# Patient Record
Sex: Male | Born: 1985 | Race: White | Hispanic: No | Marital: Single | Smoking: Never smoker
Health system: Southern US, Community
[De-identification: ages and names within clinical notes are randomized; demographics above are authoritative.]

## PROBLEM LIST (undated history)

## (undated) DIAGNOSIS — T7840XA Allergy, unspecified, initial encounter: Secondary | ICD-10-CM

## (undated) HISTORY — PX: HERNIA REPAIR: SHX51

## (undated) HISTORY — DX: Allergy, unspecified, initial encounter: T78.40XA

---

## 2017-03-31 ENCOUNTER — Encounter: Payer: Self-pay | Admitting: Family Medicine

## 2017-03-31 ENCOUNTER — Ambulatory Visit (INDEPENDENT_AMBULATORY_CARE_PROVIDER_SITE_OTHER): Payer: BLUE CROSS/BLUE SHIELD | Admitting: Family Medicine

## 2017-03-31 VITALS — BP 138/88 | HR 106 | Temp 98.4°F | Ht 76.0 in | Wt 223.2 lb

## 2017-03-31 DIAGNOSIS — N5089 Other specified disorders of the male genital organs: Secondary | ICD-10-CM | POA: Insufficient documentation

## 2017-03-31 DIAGNOSIS — R030 Elevated blood-pressure reading, without diagnosis of hypertension: Secondary | ICD-10-CM | POA: Diagnosis not present

## 2017-03-31 DIAGNOSIS — N509 Disorder of male genital organs, unspecified: Secondary | ICD-10-CM | POA: Diagnosis not present

## 2017-03-31 NOTE — Patient Instructions (Addendum)
I think you have a hydrocele or a varicocele. Both of these are benign. With your family history, I would like to get an ultrasound to make sure there is nothing else going on.  Please keep an eye on your blood pressure. It should be 140/90 or lower.  Please drop off your recent blood work when you are able to.   I would like to see you once yearly, or as needed,  Take care, Dr Jimmey RalphParker   [facility] Eating [facility] DASH stands for "Dietary Approaches to Stop Hypertension." The DASH eating plan is a healthy eating plan that has been shown to reduce high blood pressure (hypertension). It may also reduce your risk for type 2 diabetes, heart disease, and stroke. The DASH eating plan may also help with weight loss. What are tips for following this plan? General guidelines  Avoid eating more than 2,300 mg (milligrams) of salt (sodium) a day. If you have hypertension, you may need to reduce your sodium intake to 1,500 mg a day.  Limit alcohol intake to no more than 1 drink a day for nonpregnant women and 2 drinks a day for men. One drink equals 12 oz of beer, 5 oz of wine, or 1 oz of hard liquor.  Work with your health care provider to maintain a healthy body weight or to lose weight. Ask what an ideal weight is for you.  Get at least 30 minutes of exercise that causes your heart to beat faster (aerobic exercise) most days of the week. Activities may include walking, swimming, or biking.  Work with your health care provider or diet and nutrition specialist (dietitian) to adjust your eating plan to your individual calorie needs. Reading food labels  Check food labels for the amount of sodium per serving. Choose foods with less than 5 percent of the Daily Value of sodium. Generally, foods with less than 300 mg of sodium per serving fit into this eating plan.  To find whole grains, look for the word "whole" as the first word in the ingredient list. Shopping  Buy products labeled as "low-sodium" or "no  salt added."  Buy fresh foods. Avoid canned foods and premade or frozen meals. Cooking  Avoid adding salt when cooking. Use salt-free seasonings or herbs instead of table salt or sea salt. Check with your health care provider or pharmacist before using salt substitutes.  Do not fry foods. Cook foods using healthy methods such as baking, boiling, grilling, and broiling instead.  Cook with heart-healthy oils, such as olive, canola, soybean, or sunflower oil. Meal planning   Eat a balanced diet that includes: ? 5 or more servings of fruits and vegetables each day. At each meal, try to fill half of your plate with fruits and vegetables. ? Up to 6-8 servings of whole grains each day. ? Less than 6 oz of lean meat, poultry, or fish each day. A 3-oz serving of meat is about the same size as a deck of cards. One egg equals 1 oz. ? 2 servings of low-fat dairy each day. ? A serving of nuts, seeds, or beans 5 times each week. ? Heart-healthy fats. Healthy fats called Omega-3 fatty acids are found in foods such as flaxseeds and coldwater fish, like sardines, salmon, and mackerel.  Limit how much you eat of the following: ? Canned or prepackaged foods. ? Food that is high in trans fat, such as fried foods. ? Food that is high in saturated fat, such as fatty meat. ? Sweets,  desserts, sugary drinks, and other foods with added sugar. ? Full-fat dairy products.  Do not salt foods before eating.  Try to eat at least 2 vegetarian meals each week.  Eat more home-cooked food and less restaurant, buffet, and fast food.  When eating at a restaurant, ask that your food be prepared with less salt or no salt, if possible. What foods are recommended? The items listed may not be a complete list. Talk with your dietitian about what dietary choices are best for you. Grains Whole-grain or whole-wheat bread. Whole-grain or whole-wheat pasta. Brown rice. [place]. Bulgur. Whole-grain and low-sodium  cereals. Pita bread. Low-fat, low-sodium crackers. Whole-wheat flour tortillas. Vegetables Fresh or frozen vegetables (raw, steamed, roasted, or grilled). Low-sodium or reduced-sodium tomato and vegetable juice. Low-sodium or reduced-sodium tomato sauce and tomato paste. Low-sodium or reduced-sodium canned vegetables. Fruits All fresh, dried, or frozen fruit. Canned fruit in natural juice (without added sugar). Meat and other protein foods Skinless chicken or [place]. Ground chicken or [place]. Pork with fat trimmed off. Fish and seafood. Egg whites. Dried beans, peas, or lentils. Unsalted nuts, nut butters, and seeds. Unsalted canned beans. Lean cuts of beef with fat trimmed off. Low-sodium, lean deli meat. Dairy Low-fat (1%) or fat-free (skim) milk. Fat-free, low-fat, or reduced-fat cheeses. Nonfat, low-sodium ricotta or cottage cheese. Low-fat or nonfat yogurt. Low-fat, low-sodium cheese. Fats and oils Soft margarine without trans fats. Vegetable oil. Low-fat, reduced-fat, or light mayonnaise and salad dressings (reduced-sodium). Canola, safflower, olive, soybean, and sunflower oils. Avocado. Seasoning and other foods Herbs. Spices. Seasoning mixes without salt. Unsalted popcorn and pretzels. Fat-free sweets. What foods are not recommended? The items listed may not be a complete list. Talk with your dietitian about what dietary choices are best for you. Grains Baked goods made with fat, such as croissants, muffins, or some breads. Dry pasta or rice meal packs. Vegetables Creamed or fried vegetables. Vegetables in a cheese sauce. Regular canned vegetables (not low-sodium or reduced-sodium). Regular canned tomato sauce and paste (not low-sodium or reduced-sodium). Regular tomato and vegetable juice (not low-sodium or reduced-sodium). Angie Fava. Olives. Fruits Canned fruit in a light or heavy syrup. Fried fruit. Fruit in cream or butter sauce. Meat and other protein foods Fatty cuts of meat. Ribs.  Fried meat. Berniece Salines. Sausage. Bologna and other processed lunch meats. Salami. Fatback. Hotdogs. Bratwurst. Salted nuts and seeds. Canned beans with added salt. Canned or smoked fish. Whole eggs or egg yolks. Chicken or [place] with skin. Dairy Whole or 2% milk, cream, and half-and-half. Whole or full-fat cream cheese. Whole-fat or sweetened yogurt. Full-fat cheese. Nondairy creamers. Whipped toppings. Processed cheese and cheese spreads. Fats and oils Butter. Stick margarine. Lard. Shortening. Ghee. Bacon fat. Tropical oils, such as coconut, palm kernel, or palm oil. Seasoning and other foods Salted popcorn and pretzels. Onion salt, garlic salt, seasoned salt, table salt, and sea salt. Worcestershire sauce. Tartar sauce. Barbecue sauce. Teriyaki sauce. Soy sauce, including reduced-sodium. Steak sauce. Canned and packaged gravies. Fish sauce. Oyster sauce. Cocktail sauce. Horseradish that you find on the shelf. Ketchup. Mustard. Meat flavorings and tenderizers. Bouillon cubes. Hot sauce and Tabasco sauce. Premade or packaged marinades. Premade or packaged taco seasonings. Relishes. Regular salad dressings. Where to find more information:  National Heart, Lung, and [place]: https://wilson-eaton.com/  American Heart Association: www.heart.org Summary  The DASH eating plan is a healthy eating plan that has been shown to reduce high blood pressure (hypertension). It may also reduce your risk for type 2 diabetes, heart  disease, and stroke.  With the DASH eating plan, you should limit salt (sodium) intake to 2,300 mg a day. If you have hypertension, you may need to reduce your sodium intake to 1,500 mg a day.  When on the DASH eating plan, aim to eat more fresh fruits and vegetables, whole grains, lean proteins, low-fat dairy, and heart-healthy fats.  Work with your health care provider or diet and nutrition specialist (dietitian) to adjust your eating plan to your individual calorie needs. This  information is not intended to replace advice given to you by your health care provider. Make sure you discuss any questions you have with your health care provider. Document Released: 12/30/2010 Document Revised: 01/04/2016 Document Reviewed: 01/04/2016 Elsevier Interactive Patient Education  [employer].

## 2017-03-31 NOTE — Progress Notes (Signed)
Subjective:  Kevin Lutz is a 32 y.o. male who presents today with a chief complaint of testicular mass and to establish care.   HPI:  Testicular Mass, New Problem  Patient first noticed this several months ago.  He was evaluated by physician about 5 months ago who told him that was benign.  Mass is located to the posterior aspect of his left testicle.  No pain.  Mass has stayed the same for the past several months.  No hematuria.  No blood in semen.  No recent trauma.  No obvious precipitating events.  No other obvious alleviating or aggravating factors.  ROS: Per HPI, otherwise a complete review of systems was negative.   PMH:  The following were reviewed and entered/updated in epic: Past Medical History:  Diagnosis Date  . Allergy   . 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 Testicular mass 03/31/2017   Past Surgical History:  Procedure Laterality Date  . HERNIA REPAIR      Family History  Problem Relation Age of Onset  . Rheumatic fever Father   . Testicular cancer Father   . Arthritis Maternal Grandmother   . Hip fracture Paternal Grandmother   . Diabetes Paternal Grandfather    Medications- reviewed and updated No current outpatient medications on file.   No current facility-administered medications for this visit.    Allergies-reviewed and updated No Known Allergies  Social History   Socioeconomic History  . Marital status: Single    Spouse name: None  . Number of children: None  . Years of education: None  . Highest education level: None  Social Needs  . Financial resource strain: None  . Food insecurity - worry: None  . Food insecurity - inability: None  . Transportation needs - medical: None  . Transportation needs - non-medical: None  Occupational History  . None  Tobacco Use  . Smoking status: Never Smoker  . Smokeless tobacco: Never Used  Substance and Sexual Activity    . Alcohol use: Yes    Comment: occasional  . Drug use: No  . Sexual activity: Yes  Other Topics Concern  . None  Social History Narrative  . None   Objective:  Physical Exam: BP 138/88 (BP Location: Left Arm, Cuff Size: Large)   Pulse (!) 106   Temp 98.4 F (36.9 C) (Oral)   Ht  (1.93 m)   Wt 223 lb 3.2 oz (101.2 kg)   SpO2 99%   BMI 27.17 kg/m   Gen: NAD, resting comfortably CV: RRR with no murmurs appreciated Pulm: NWOB, CTAB with no crackles, wheezes, or rhonchi GI: Normal bowel sounds present. Soft, Nontender, Nondistended. GU: Normal male genitalia.  Left testicle with small, approximately 5 mm nodule at epididymis.  Nontender to palpation. MSK: No edema, cyanosis, or clubbing noted Skin: Warm, dry Neuro: Grossly normal, moves all extremities Psych: Normal affect and thought content  Assessment/Plan:  Testicular mass Most likely hydrocele or varicocele.  However he has a family history of testicular cancer in his father.  With this in mind, we will obtain an ultrasound for definitive diagnosis.  Elevated blood pressure Patient with blood pressure of 138/88 today.  Per patient he is usually in normal range at home.  We will not start medications today.  Advised close home blood pressure monitoring with goal 140/90 or lower.  Discussed lifestyle modifications including low-salt diet  and regular exercise.  Preventative healthcare Patient gets yearly cholesterol screening through his work.  He will be bringing those records to our office.  Katina Degree. Jimmey Ralph, MD 03/31/2017 9:21 AM

## 2017-03-31 NOTE — Assessment & Plan Note (Signed)
Most likely hydrocele or varicocele.  However he has a family history of testicular cancer in his father.  With this in mind, we will obtain an ultrasound for definitive diagnosis.

## 2017-04-11 ENCOUNTER — Ambulatory Visit
Admission: RE | Admit: 2017-04-11 | Discharge: 2017-04-11 | Disposition: A | Discharge status: Home / Self Care | Payer: BLUE CROSS/BLUE SHIELD | Source: Ambulatory Visit | Attending: Family Medicine | Admitting: Family Medicine

## 2017-04-11 DIAGNOSIS — N5089 Other specified disorders of the male genital organs: Secondary | ICD-10-CM

## 2017-04-12 NOTE — Progress Notes (Signed)
LVMOM with message from Dr. Jimmey RalphParker

## 2017-04-12 NOTE — Progress Notes (Signed)
LVMOM as to results

## 2018-05-09 ENCOUNTER — Telehealth: Payer: Self-pay | Admitting: Family Medicine

## 2018-05-09 NOTE — Telephone Encounter (Signed)
Pt has not been seen in over a year. Mail box is full

## 2019-10-29 ENCOUNTER — Encounter: Payer: Self-pay | Admitting: Family Medicine

## 2019-10-29 ENCOUNTER — Other Ambulatory Visit: Payer: Self-pay

## 2019-10-29 ENCOUNTER — Ambulatory Visit (INDEPENDENT_AMBULATORY_CARE_PROVIDER_SITE_OTHER): Payer: BC Managed Care – PPO | Admitting: Family Medicine

## 2019-10-29 VITALS — BP 128/78 | HR 94 | Temp 98.2°F | Ht 76.0 in | Wt 225.6 lb

## 2019-10-29 DIAGNOSIS — Z23 Encounter for immunization: Secondary | ICD-10-CM | POA: Diagnosis not present

## 2019-10-29 DIAGNOSIS — Z131 Encounter for screening for diabetes mellitus: Secondary | ICD-10-CM

## 2019-10-29 DIAGNOSIS — Z13220 Encounter for screening for lipoid disorders: Secondary | ICD-10-CM | POA: Diagnosis not present

## 2019-10-29 DIAGNOSIS — R430 Anosmia: Secondary | ICD-10-CM | POA: Diagnosis not present

## 2019-10-29 DIAGNOSIS — Z0001 Encounter for general adult medical examination with abnormal findings: Secondary | ICD-10-CM

## 2019-10-29 DIAGNOSIS — L709 Acne, unspecified: Secondary | ICD-10-CM | POA: Insufficient documentation

## 2019-10-29 NOTE — Assessment & Plan Note (Signed)
Stable.  Continue management per dermatology. 

## 2019-10-29 NOTE — Progress Notes (Signed)
Chief Complaint:  Kevin Lutz is a 34 y.o. male who presents today for his annual comprehensive physical exam.    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  Anticipate continued improvement over the next several months.  Chronic Problems Addressed Today: Acne Stable.  Continue management per dermatology.  Preventative Healthcare: Check nonfasting lipids and glucose.  Flu vaccine given today.  Up-to-date on other vaccines.  Okay has perfect  Patient Counseling(The following topics were reviewed and/or handout was given):  -Nutrition: Stressed importance of moderation in sodium/caffeine intake, saturated fat and cholesterol, caloric balance, sufficient intake of fresh fruits, vegetables, and fiber.  -Stressed the importance of regular exercise.   -Substance Abuse: Discussed cessation/primary prevention of tobacco, alcohol, or other drug use; driving or other dangerous activities under the influence; availability of treatment for abuse.   -Injury prevention: Discussed safety belts, safety helmets, smoke detector, smoking near bedding or upholstery.   -Sexuality: Discussed sexually transmitted diseases, partner selection, use of condoms, avoidance of unintended pregnancy and contraceptive alternatives.   -Dental health: Discussed importance of regular tooth brushing, flossing, and dental visits.  -Health maintenance and immunizations reviewed. Please refer to Health maintenance section.  Return to care in 1 year for next preventative visit.     Subjective:  HPI:  He has no acute complaints today.   Had covid earlier this year. Recovered after about a week. Still has smell difficulty. Normal breathing and back to normal.   Lifestyle Diet: Balanced.  Exercise: Mix between weight and cardio. 5 times per week.   Depression screen PHQ 2/9 03/31/2017  Decreased Interest 0  Down, Depressed, Hopeless 0  PHQ - 2 Score 0    Health  Maintenance Due  Topic Date Due  . Hepatitis C Screening  Never done  . HIV Screening  Never done     ROS: Per HPI, otherwise a complete review of systems was negative.   PMH:  The following were reviewed and entered/updated in epic: Past Medical History:  Diagnosis Date  . Allergy   . 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 Acne 10/29/2019   Past Surgical History:  Procedure Laterality Date  . HERNIA REPAIR      Family History  Problem Relation Age of Onset  . Rheumatic fever Father   . Testicular cancer Father   . Arthritis Maternal Grandmother   . Hip fracture Paternal Grandmother   . Diabetes Paternal Grandfather     Medications- reviewed and updated Current Outpatient Medications  Medication Sig Dispense Refill  . clindamycin (CLINDAGEL) 1 % gel Apply topically every morning.    Marland Kitchen doxycycline (VIBRAMYCIN) 100 MG capsule Take 100 mg by mouth daily.    Marland Kitchen tretinoin (RETIN-A) 0.025 % cream SMARTSIG:Sparingly Topical Every Night    . tretinoin (RETIN-A) 0.05 % cream SMARTSIG:Sparingly Topical Every Evening     No current facility-administered medications for this visit.    Allergies-reviewed and updated No Known Allergies  Social History   Socioeconomic History  . Marital status: Single    Spouse name: Not on file  . Number of children: Not on file  . Years of education: Not on file  . Highest education level: Not on file  Occupational History  . Not on file  Tobacco Use  . Smoking status: Never Smoker  . Smokeless tobacco: Never Used  Vaping Use  . Vaping Use: Never  used  Substance and Sexual Activity  . Alcohol use: Yes    Comment: occasional  . Drug use: No  . Sexual activity: Yes  Other Topics Concern  . Not on file  Social History Narrative  . Not on file   Social Determinants of Health   Financial Resource Strain:   . Difficulty of Paying Living Expenses: Not on file    Food Insecurity:   . Worried About Programme researcher, broadcasting/film/video in the Last Year: Not on file  . Ran Out of Food in the Last Year: Not on file  Transportation Needs:   . Lack of Transportation (Medical): Not on file  . Lack of Transportation (Non-Medical): Not on file  Physical Activity:   . Days of Exercise per Week: Not on file  . Minutes of Exercise per Session: Not on file  Stress:   . Feeling of Stress : Not on file  Social Connections:   . Frequency of Communication with Friends and Family: Not on file  . Frequency of Social Gatherings with Friends and Family: Not on file  . Attends Religious Services: Not on file  . Active Member of Clubs or Organizations: Not on file  . Attends Banker Meetings: Not on file  . Marital Status: Not on file        Objective:  Physical Exam: BP 128/78   Pulse 94   Temp 98.2 F (36.8 C) (Temporal)   Ht 6\' 4"  (1.93 m)   Wt 225 lb 9.6 oz (102.3 kg)   SpO2 100%   BMI 27.46 kg/m   Body mass index is 27.46 kg/m. Wt Readings from Last 3 Encounters:  10/29/19 225 lb 9.6 oz (102.3 kg)  03/31/17 223 lb 3.2 oz (101.2 kg)   Gen: NAD, resting comfortably HEENT: TMs normal bilaterally. OP clear. No thyromegaly noted.  CV: RRR with no murmurs appreciated Pulm: NWOB, CTAB with no crackles, wheezes, or rhonchi GI: Normal bowel sounds present. Soft, Nontender, Nondistended. MSK: no edema, cyanosis, or clubbing noted Skin: warm, dry Neuro: CN2-12 grossly intact. Strength 5/5 in upper and lower extremities. Reflexes symmetric and intact bilaterally.  Psych: Normal affect and thought content     Coulson Wehner M. 05/31/17, MD 10/29/2019 10:47 AM

## 2019-10-29 NOTE — Patient Instructions (Signed)
It was very nice to see you today!  We will check blood work today.  Your sense of smell should eventually come back.  I will see you back in a year.  Please come back to see me sooner if needed.  Take care, Dr Jerline Pain  Please try these tips to maintain a healthy lifestyle:   Eat at least 3 REAL meals and 1-2 snacks per day.  Aim for no more than 5 hours between eating.  If you eat breakfast, please do so within one hour of getting up.    Each meal should contain half fruits/vegetables, one quarter protein, and one quarter carbs (no bigger than a computer mouse)   Cut down on sweet beverages. This includes juice, soda, and sweet tea.     Drink at least 1 glass of water with each meal and aim for at least 8 glasses per day   Exercise at least 150 minutes every week.    Preventive Care 19-21 Years Old, Male Preventive care refers to lifestyle choices and visits with your health care provider that can promote health and wellness. This includes:  A yearly physical exam. This is also called an annual well check.  Regular dental and eye exams.  Immunizations.  Screening for certain conditions.  Healthy lifestyle choices, such as eating a healthy diet, getting regular exercise, not using drugs or products that contain nicotine and tobacco, and limiting alcohol use. What can I expect for my preventive care visit? Physical exam Your health care provider will check:  Height and weight. These may be used to calculate body mass index (BMI), which is a measurement that tells if you are at a healthy weight.  Heart rate and blood pressure.  Your skin for abnormal spots. Counseling Your health care provider may ask you questions about:  Alcohol, tobacco, and drug use.  Emotional well-being.  Home and relationship well-being.  Sexual activity.  Eating habits.  Work and work Statistician. What immunizations do I need?  Influenza (flu) vaccine  This is recommended  every year. Tetanus, diphtheria, and pertussis (Tdap) vaccine  You may need a Td booster every 10 years. Varicella (chickenpox) vaccine  You may need this vaccine if you have not already been vaccinated. Human papillomavirus (HPV) vaccine  If recommended by your health care provider, you may need three doses over 6 months. Measles, mumps, and rubella (MMR) vaccine  You may need at least one dose of MMR. You may also need a second dose. Meningococcal conjugate (MenACWY) vaccine  One dose is recommended if you are 49-10 years old and a Market researcher living in a residence hall, or if you have one of several medical conditions. You may also need additional booster doses. Pneumococcal conjugate (PCV13) vaccine  You may need this if you have certain conditions and were not previously vaccinated. Pneumococcal polysaccharide (PPSV23) vaccine  You may need one or two doses if you smoke cigarettes or if you have certain conditions. Hepatitis A vaccine  You may need this if you have certain conditions or if you travel or work in places where you may be exposed to hepatitis A. Hepatitis B vaccine  You may need this if you have certain conditions or if you travel or work in places where you may be exposed to hepatitis B. Haemophilus influenzae type b (Hib) vaccine  You may need this if you have certain risk factors. You may receive vaccines as individual doses or as more than one vaccine together in  one shot (combination vaccines). Talk with your health care provider about the risks and benefits of combination vaccines. What tests do I need? Blood tests  Lipid and cholesterol levels. These may be checked every 5 years starting at age 19.  Hepatitis C test.  Hepatitis B test. Screening   Diabetes screening. This is done by checking your blood sugar (glucose) after you have not eaten for a while (fasting).  Sexually transmitted disease (STD) testing. Talk with your health  care provider about your test results, treatment options, and if necessary, the need for more tests. Follow these instructions at home: Eating and drinking   Eat a diet that includes fresh fruits and vegetables, whole grains, lean protein, and low-fat dairy products.  Take vitamin and mineral supplements as recommended by your health care provider.  Do not drink alcohol if your health care provider tells you not to drink.  If you drink alcohol: ? Limit how much you have to 0-2 drinks a day. ? Be aware of how much alcohol is in your drink. In the [place] one drink equals one 12 oz bottle of beer (355 mL), one 5 oz glass of wine (148 mL), or one 1 oz glass of hard liquor (44 mL). Lifestyle  Take daily care of your teeth and gums.  Stay active. Exercise for at least 30 minutes on 5 or more days each week.  Do not use any products that contain nicotine or tobacco, such as cigarettes, e-cigarettes, and chewing tobacco. If you need help quitting, ask your health care provider.  If you are sexually active, practice safe sex. Use a condom or other form of protection to prevent STIs (sexually transmitted infections). What's next?  Go to your health care provider once a year for a well check visit.  Ask your health care provider how often you should have your eyes and teeth checked.  Stay up to date on all vaccines. This information is not intended to replace advice given to you by your health care provider. Make sure you discuss any questions you have with your health care provider. Document Revised: 01/04/2018 Document Reviewed: 01/04/2018 Elsevier Patient Education  2020 [employer].

## 2019-10-30 ENCOUNTER — Encounter: Payer: Self-pay | Admitting: Family Medicine

## 2019-10-30 DIAGNOSIS — E781 Pure hyperglyceridemia: Secondary | ICD-10-CM | POA: Insufficient documentation

## 2019-10-30 LAB — LIPID PANEL
Cholesterol: 192 mg/dL (ref <200)
HDL: 62 mg/dL (ref >=40)
LDL Cholesterol (Calc): 98 mg/dL (calc)
Non-HDL Cholesterol (Calc): 130 mg/dL (calc) — ABNORMAL HIGH (ref <130)
Total CHOL/HDL Ratio: 3.1 (calc) (ref <5.0)
Triglycerides: 196 mg/dL — ABNORMAL HIGH (ref <150)

## 2019-10-30 LAB — GLUCOSE, RANDOM: Glucose, Plasma: 82 mg/dL (ref 65–139)

## 2019-10-30 NOTE — Progress Notes (Signed)
Please inform patient of the following:  Triglycerides a bit high but everything else is NORMAL. Do not need to start medications but he should work on diet and exercise and we can recheck in a year or so.  Kevin Lutz. Jimmey Ralph, MD 10/30/2019 12:09 PM

## 2020-01-15 IMAGING — US US SCROTUM
1 series · 14 of 25 positions shown · non-contrast
Comparison: None.

CLINICAL DATA: Two months of a palpable left-sided scrotal mass. It
is felt to likely be extratesticular.

EXAM:
ULTRASOUND OF SCROTUM
TECHNIQUE: Complete ultrasound examination of the testicles, epididymis, and
other scrotal structures was performed.

[Series 1: us scrotum · 0.08mm/px · 14 of 53 slices shown]
[im 1/53]
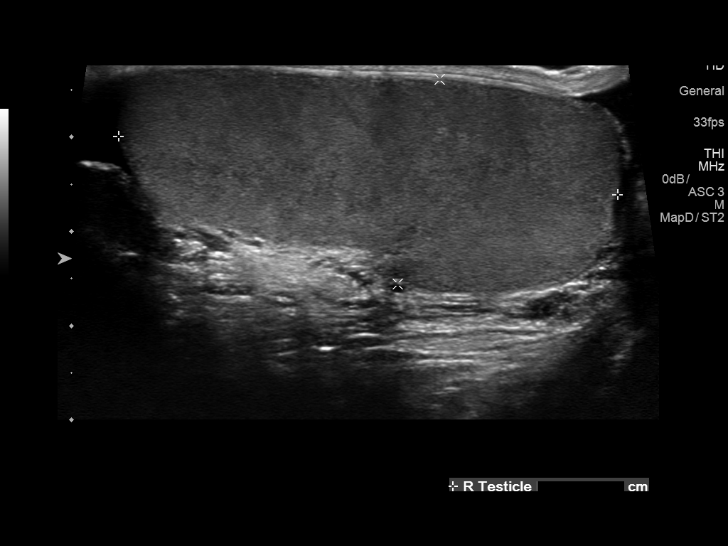
[im 5/53]
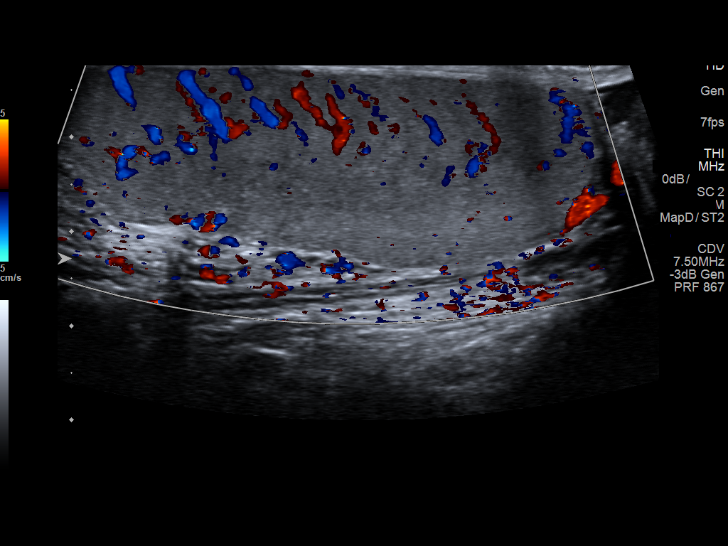
[im 9/53]
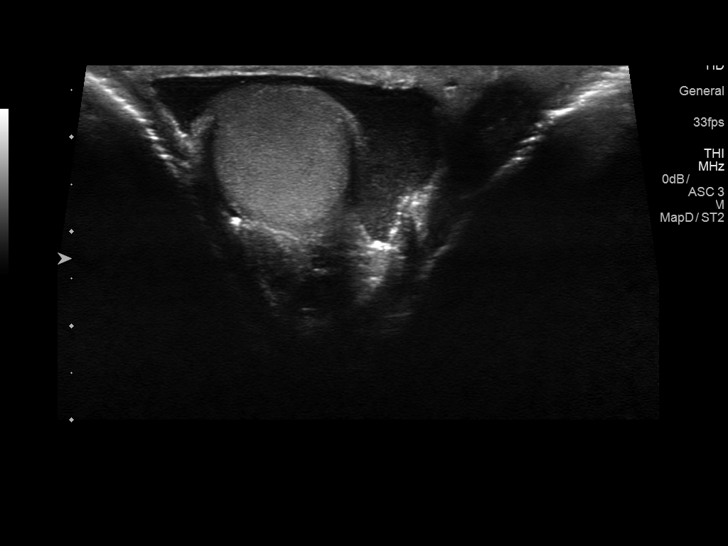
[im 14/53]
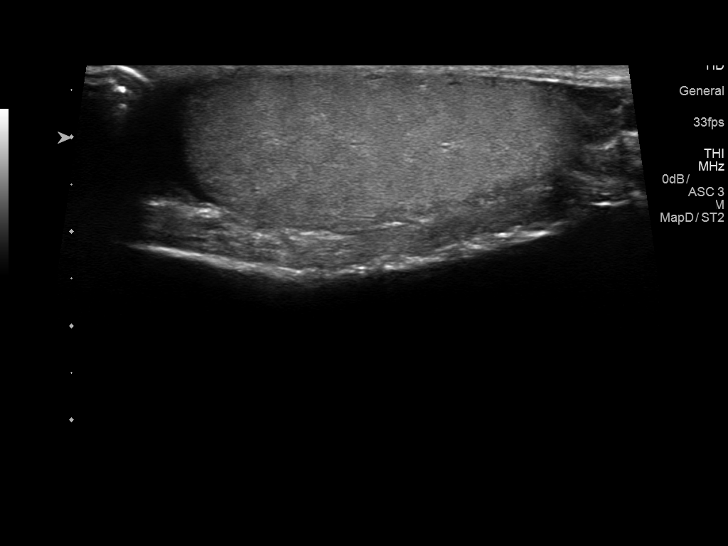
[im 18/53]
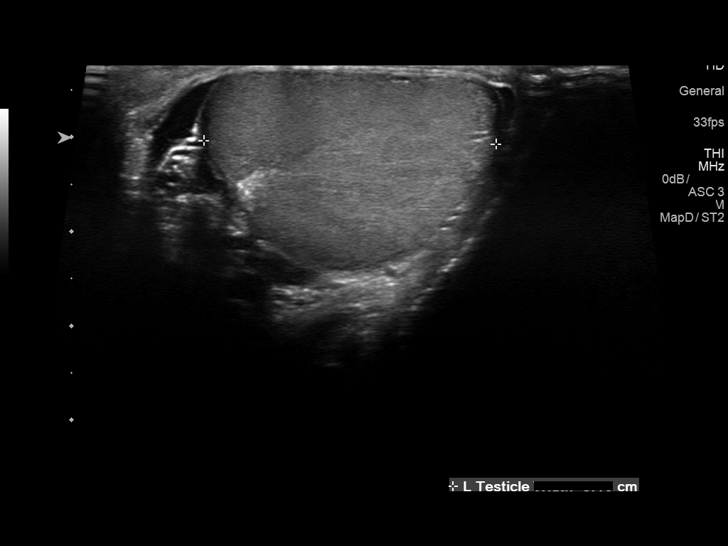
[im 20/53]
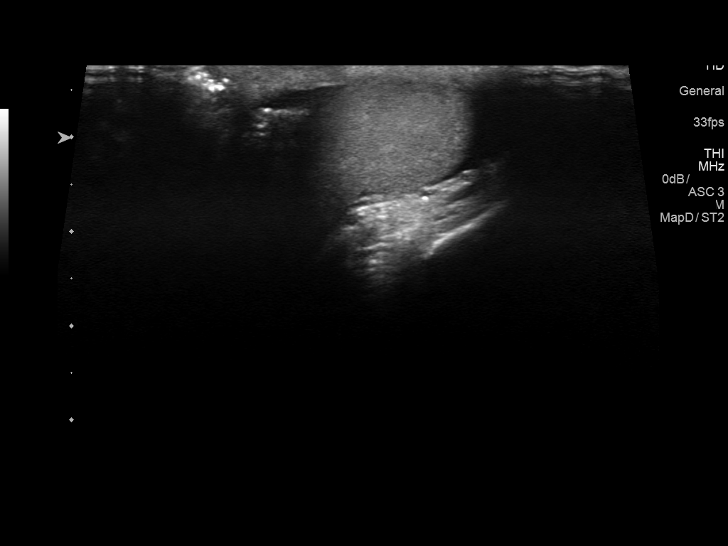
[im 24/53]
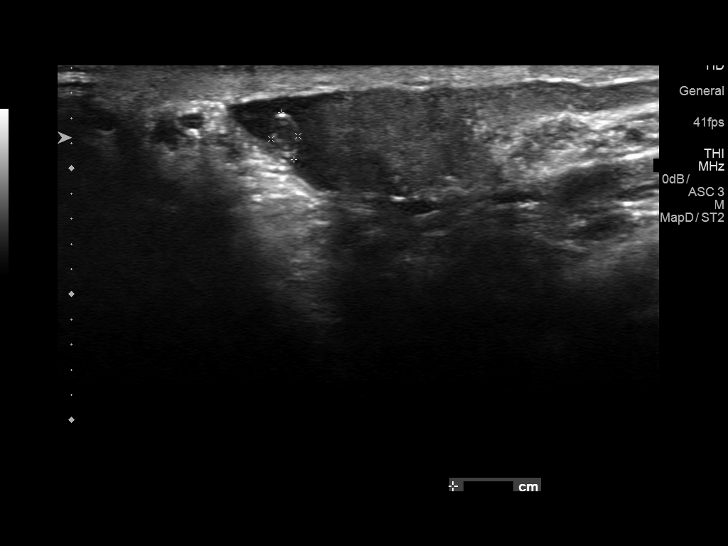
[im 29/53]
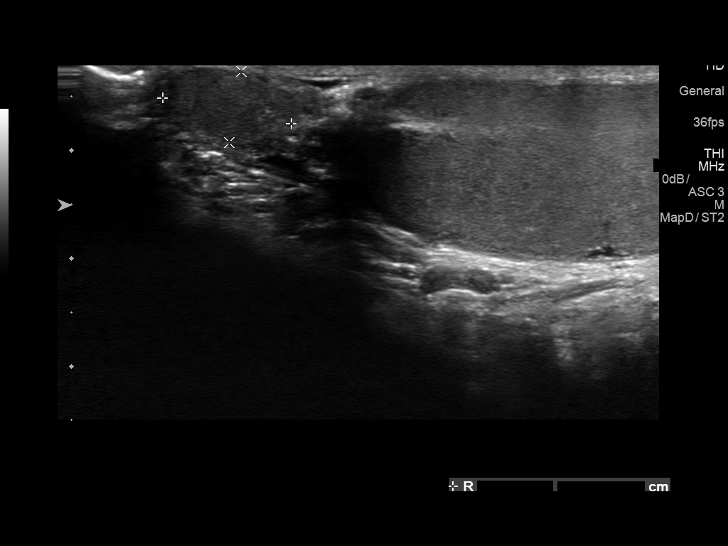
[im 33/53]
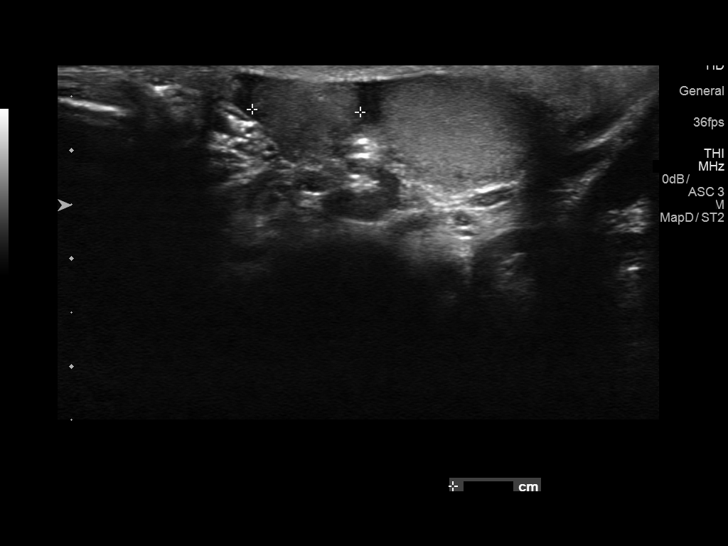
[im 35/53]
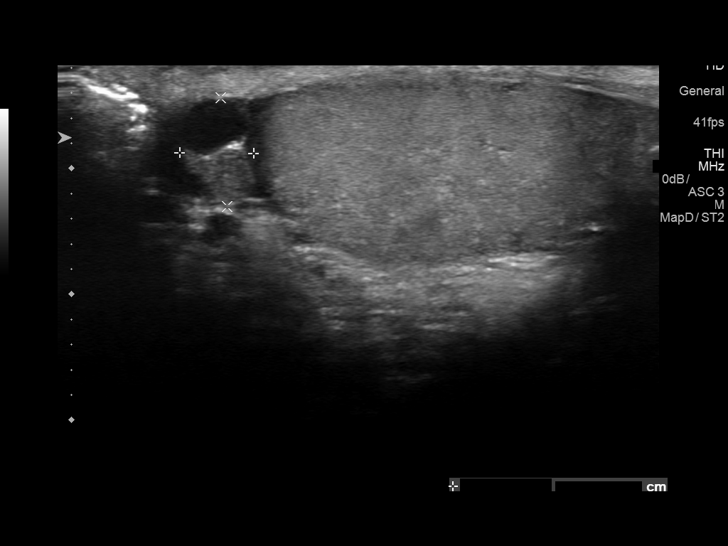
[im 40/53]
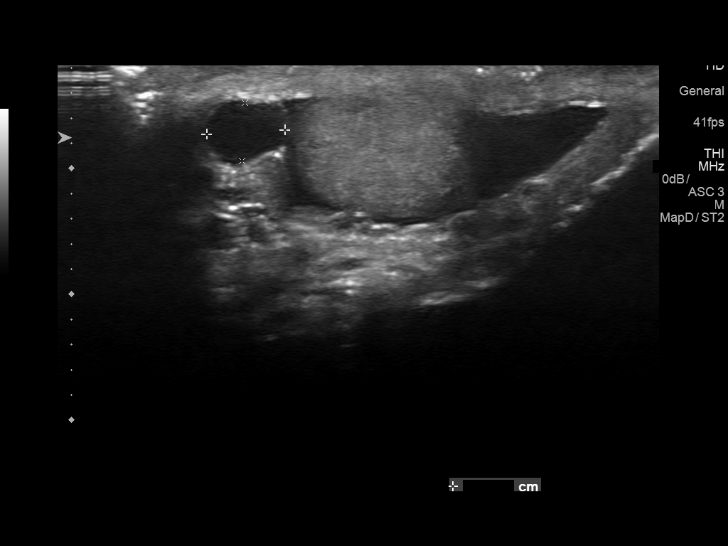
[im 44/53]
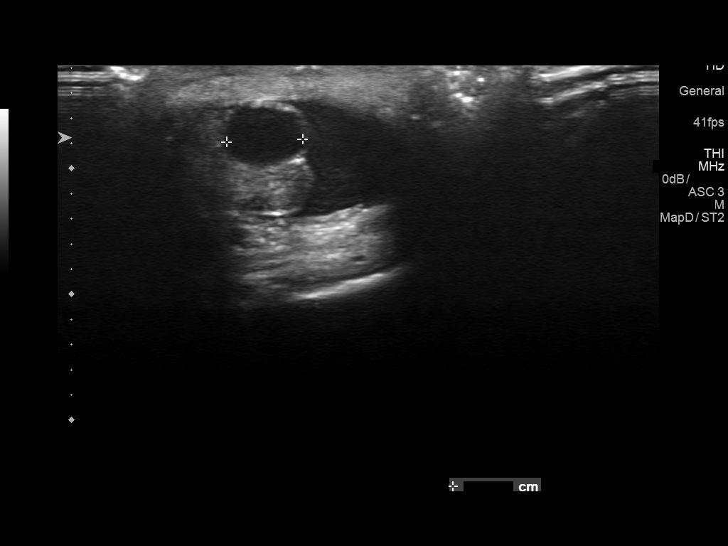
[im 48/53]
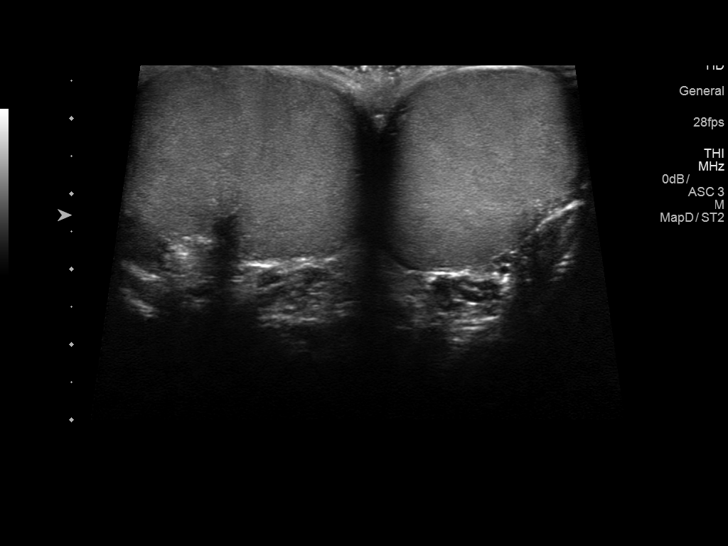
[im 53/53]
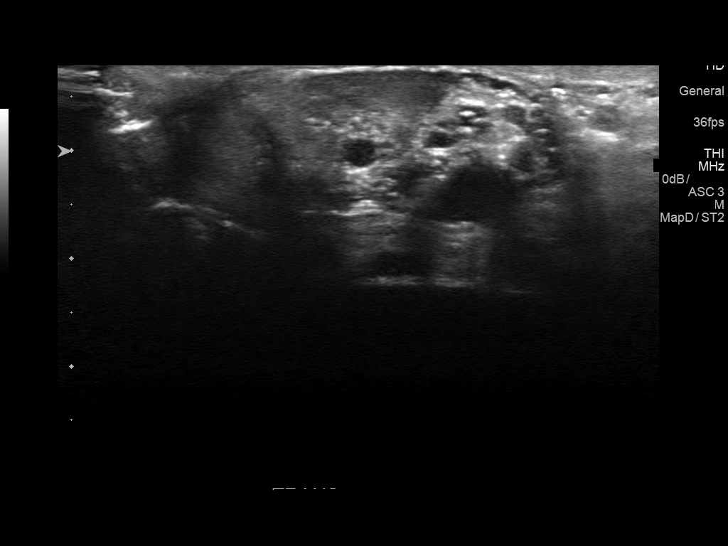

[14 of 25 positions shown; findings below may reference images not displayed]

FINDINGS: Right testicle

Measurements: 5.3 x 2.2 x 2.8 cm. No mass or microlithiasis
visualized.

Left testicle

Measurements: 5.1 x 2.2 x 3.1 cm. No mass or microlithiasis
visualized.

Right epididymis:  Normal in size and appearance.

Left epididymis: There is a cyst in the epididymal head measuring 6
mm in diameter. There is a 4 mm cyst in the body of the left
epididymis which appears to correspond to the palpable area cording
to the patient. An appendix testis on the left is suspected
measuring up to 4 mm in greatest dimension.

Hydrocele:  None visualized.

Varicocele:  None visualized.
IMPRESSION: No intra testicular mass is observed.

Epididymal head and body cysts on the left. The cyst in the body
corresponds to the palpable finding according to the patient.

## 2021-10-18 ENCOUNTER — Encounter: Payer: Self-pay | Admitting: *Deleted

## 2022-01-06 ENCOUNTER — Encounter: Payer: Self-pay | Admitting: *Deleted
